# Patient Record
Sex: Female | Born: 1960 | Race: White | Hispanic: No | Marital: Married | Smoking: Never smoker
Health system: Southern US, Community
[De-identification: ages and names within clinical notes are randomized; demographics above are authoritative.]

## PROBLEM LIST (undated history)

## (undated) DIAGNOSIS — J45909 Unspecified asthma, uncomplicated: Secondary | ICD-10-CM

## (undated) HISTORY — PX: OTHER SURGICAL HISTORY: SHX169

---

## 2003-08-01 ENCOUNTER — Emergency Department (HOSPITAL_COMMUNITY): Admission: EM | Admit: 2003-08-01 | Discharge: 2003-08-01 | Payer: Self-pay | Admitting: Emergency Medicine

## 2003-08-06 ENCOUNTER — Encounter: Admission: RE | Admit: 2003-08-06 | Discharge: 2003-08-06 | Payer: Self-pay | Admitting: Specialist

## 2004-05-31 IMAGING — CT CT EXTREM LOW W/O CM*R*
3 of 4 series · 15 of 33 positions shown, 18 images · non-contrast
Comparison: none

CLINICAL DATA: Trimalleolar ankle fracture with small posterior talar fracture, [REDACTED] radiograph 08/01/03, post injury 07/31/03 for further assessment with CT. 
CT RIGHT LOWER EXTREMITY W/O CONTRAST
High resolution bone algorithm technique axial images were obtained through the left ankle in cast with no IV contrast and comparison made with [REDACTED] radiographs right tibia/fibula, ankle, and foot of 08/01/03.  Comminuted essentially non displaced medial malleolar fracture is seen.  Tiny chip fracture is seen at the posterior malleolus (images 11-10).  Non displaced distal fibular fracture is also noted.  Small posterior talar fracture shows 2mm posterior displacement (image 17).    Minimally displaced anterior 3mm fracture fragment and more posteriorly non displaced medial talar fracture is seen (images 17-20). 
IMPRESSION
Since right ankle [REDACTED] radiograph of 08/01/03 no interval change: 
1.  Trimalleolar ankle fracture most marked at the medial malleolus. 
2.  Small posteriorly displaced talar fracture.
3.  Medial talar fracture as described. 
CT MULTIPLANAR RECONSTRUCTION
From axial source images sagittal and coronal images were obtained through the right ankle and hindfoot.  Comparison is made with [REDACTED] right foot ankle tibia/fibula radiograph 08/01/03 and axial CT [REDACTED] 08/06/03.  Comminuted intra-articular fracture extending to the anterior tibial plafond is seen at the medial malleolus.  Slight 2mm posteriorly displaced fracture fragment is seen at the posterior medial distal tibia with no extension to the articular surface appreciated.  Non displaced distal fibular/lateral malleolus fracture is noted.  Best seen on coronal image 19 is faint 2 and 1mm possible loose bodies.  Small avulsion fracture fragments are seen along the deltoid ligament (image 18).  Similarly tiny 1mm avulsion fracture fragment is seen along the lateral collateral ligament as well.  Remainder of study is as described in axial CT and previous radiographic support.   Non displaced medial talar fracture is seen (images 20-23). 
1.  Trimalleolar right ankle fracture especially at the medial malleolus with probable tiny medial loose bodies at the tibiotalar joint. 
2.  Tiny associated avulsion fracture fragments at the medial and lateral collateral ligaments.  
3.  Small non displaced medial talar fracture.

[Series 3: recon 2: · axial · 0.31mm/px · z∈[-318,-228]mm · 7 of 181 slices shown, 9 images]
[im 19/181  soft-tissue]
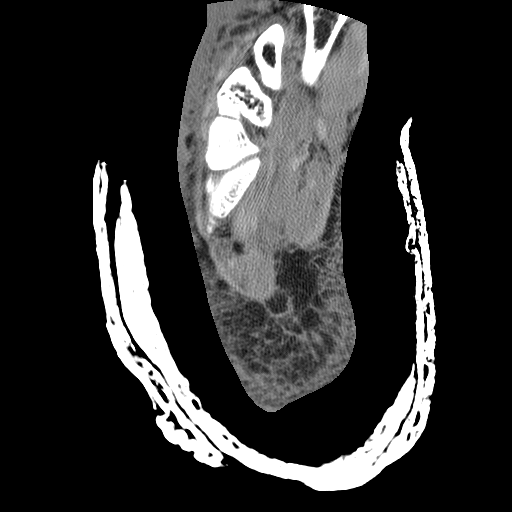
[im 19/181  bone]
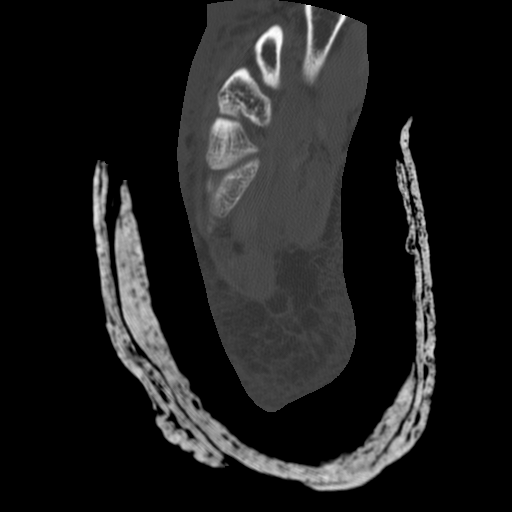
[im 37/181  bone]
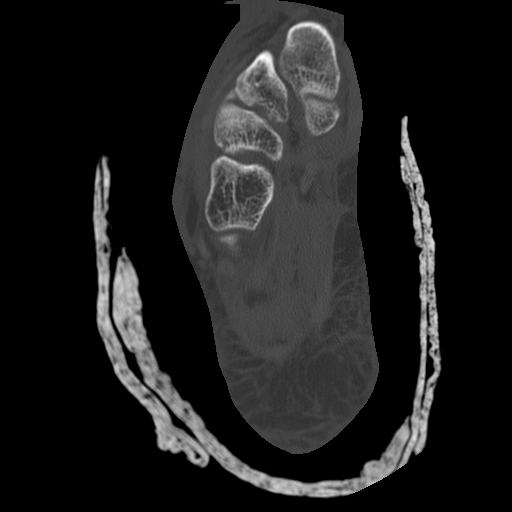
[im 73/181  bone]
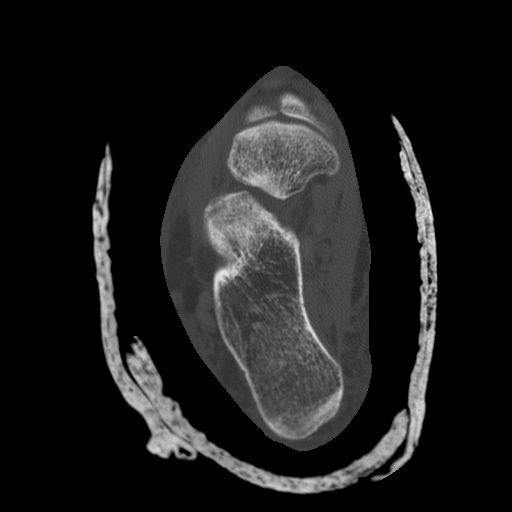
[im 91/181  bone]
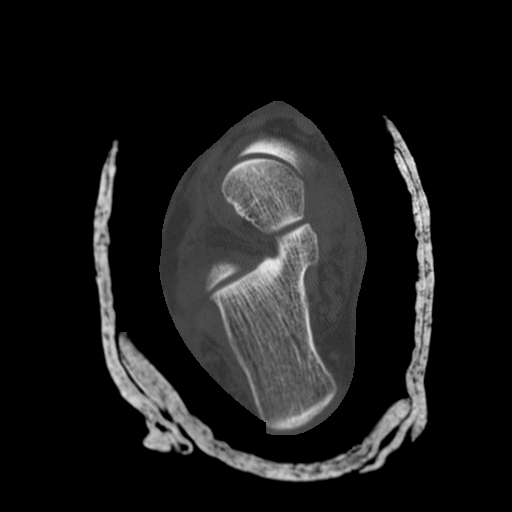
[im 109/181  soft-tissue]
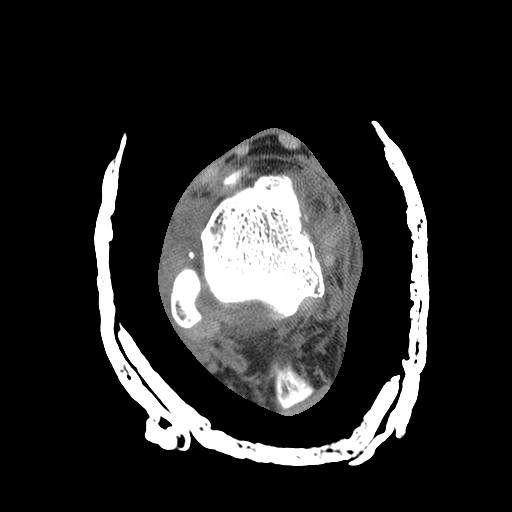
[im 109/181  bone]
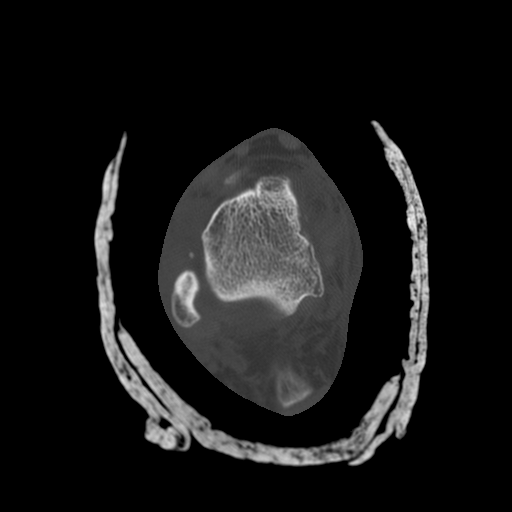
[im 145/181  bone]
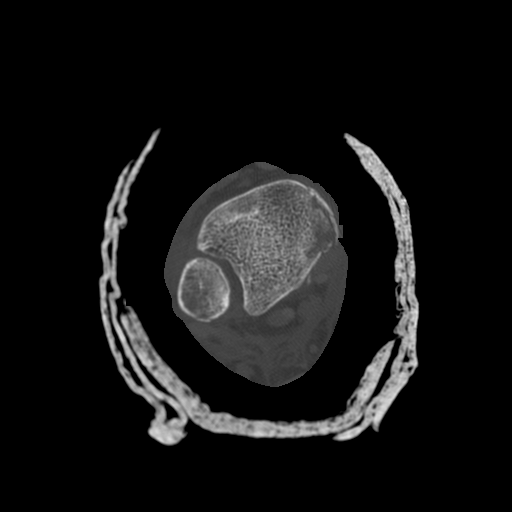
[im 163/181  bone]
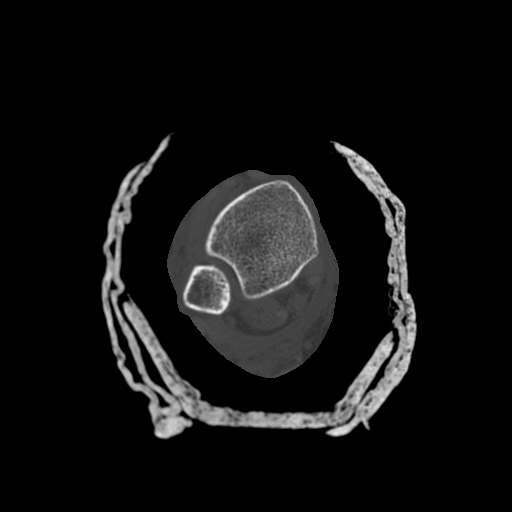

[Series 615: reformatted · coronal · 0.20mm/px · 3 of 40 slices shown (1 of 2)]
[im 8/40  bone]
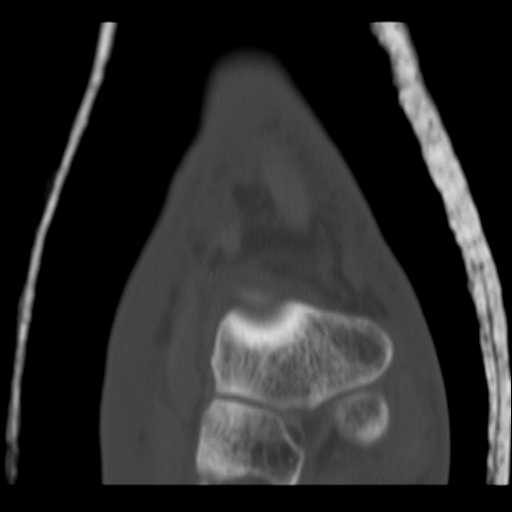
[im 16/40  bone]
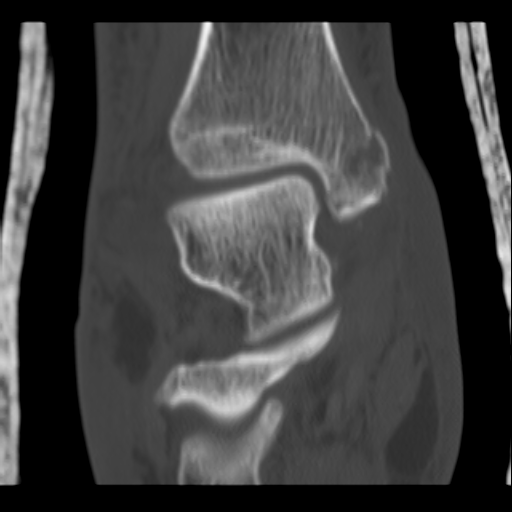
[im 24/40  bone]
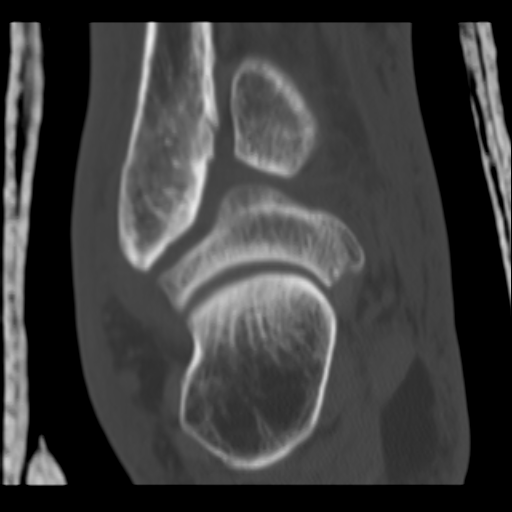

[Series 616: reformatted · sagittal · 0.23mm/px · 5 of 30 slices shown, 6 images (2 of 2)]
[im 10/30  bone]
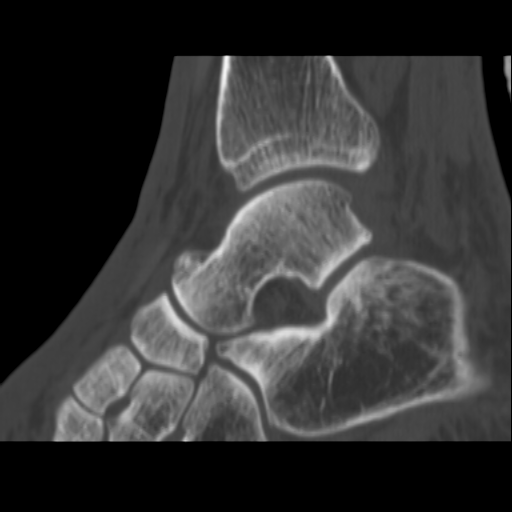
[im 13/30  bone]
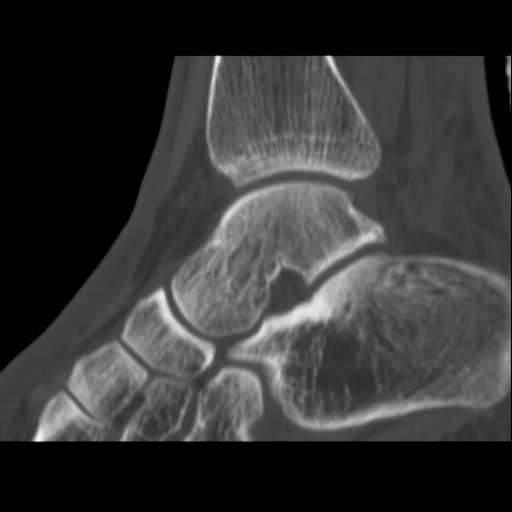
[im 15/30  soft-tissue]
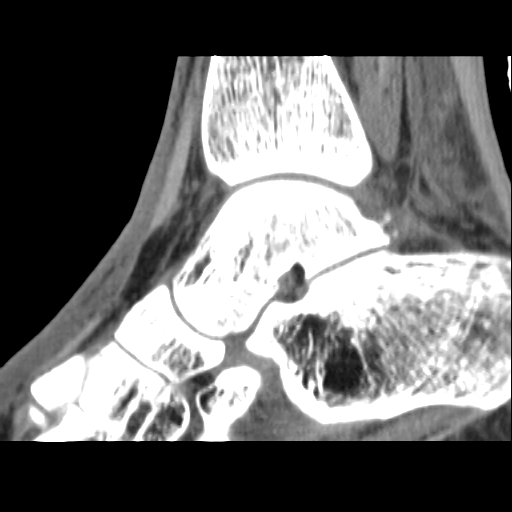
[im 15/30  bone]
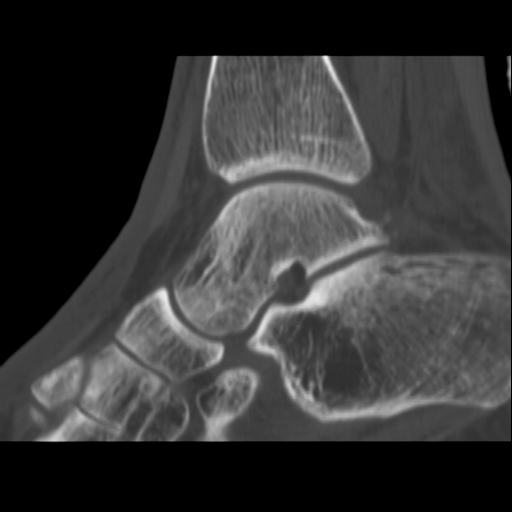
[im 17/30  bone]
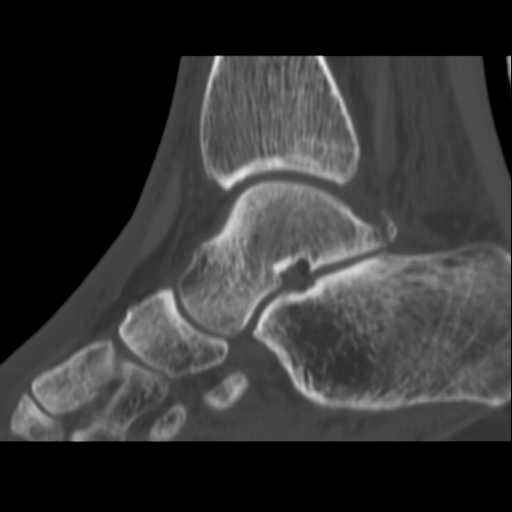
[im 20/30  bone]
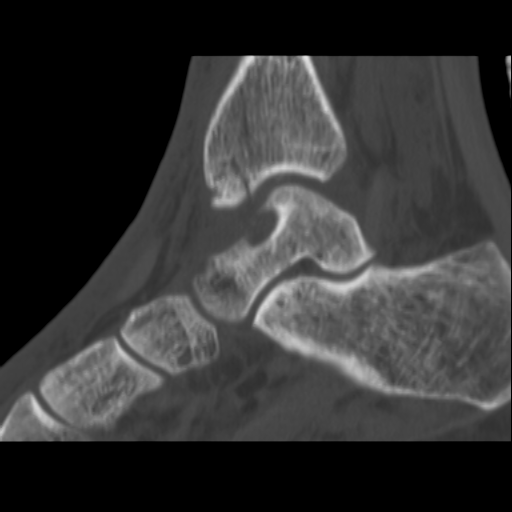

[15 of 33 positions shown; findings below may reference images not displayed]

## 2018-01-15 ENCOUNTER — Ambulatory Visit: Payer: Self-pay | Admitting: Orthopedic Surgery

## 2018-01-15 ENCOUNTER — Other Ambulatory Visit: Payer: Self-pay | Admitting: Orthopedic Surgery

## 2018-01-16 NOTE — Pre-Procedure Instructions (Signed)
Macarena Langseth  01/16/2018      [facility] - [place], [place] - [address] AT [facility] [address] [place] [address] Phone: [phone] Fax: [phone]    Your procedure is scheduled on 01/18/2018.  Report to [facility] Admitting at [phone] A.M.  Call this number if you have problems the morning of surgery:  [phone]   Remember:  Nothing to eat or drink after midnight the night before your surgery.    Take these medicines the morning of surgery with A SIP OF WATER: Montelukast (Singular)  7 days prior to surgery STOP taking any Aspirin (unless otherwise instructed by your surgeon), Aleve, Naproxen, Ibuprofen, Motrin, Advil, Goody's, BC's, all herbal medications, fish oil, and all vitamins     Do not wear jewelry, make-up or nail polish.  Do not wear lotions, powders, or perfumes, or deodorant.  Do not shave 48 hours prior to surgery.    Do not bring valuables to the hospital.  [facility] is not responsible for any belongings or valuables.  Hearing aids, eyeglasses, contacts, dentures or bridgework may not be worn into surgery.  Leave your suitcase in the car.  After surgery it may be brought to your room.  For patients admitted to the hospital, discharge time will be determined by your treatment team.  Patients discharged the day of surgery will not be allowed to drive home.   Name and phone number of your driver:    Special instructions:   [facility]- Preparing For Surgery  Before surgery, you can play an important role. Because skin is not sterile, your skin needs to be as free of germs as possible. You can reduce the number of germs on your skin by washing with CHG (chlorahexidine gluconate) Soap before surgery.  CHG is an antiseptic cleaner which kills germs and bonds with the skin to continue killing germs even after washing.    Oral Hygiene is also important to reduce your risk of infection.   Remember - BRUSH YOUR TEETH THE MORNING OF SURGERY WITH YOUR REGULAR TOOTHPASTE  Please do not use if you have an allergy to CHG or antibacterial soaps. If your skin becomes reddened/irritated stop using the CHG.  Do not shave (including legs and underarms) for at least 48 hours prior to first CHG shower. It is OK to shave your face.  Please follow these instructions carefully.   1. Shower the NIGHT BEFORE SURGERY and the MORNING OF SURGERY with CHG.   2. If you chose to wash your hair, wash your hair first as usual with your normal shampoo.  3. After you shampoo, rinse your hair and body thoroughly to remove the shampoo.  4. Use CHG as you would any other liquid soap. You can apply CHG directly to the skin and wash gently with a scrungie or a clean washcloth.   5. Apply the CHG Soap to your body ONLY FROM THE NECK DOWN.  Do not use on open wounds or open sores. Avoid contact with your eyes, ears, mouth and genitals (private parts). Wash Face and genitals (private parts)  with your normal soap.  6. Wash thoroughly, paying special attention to the area where your surgery will be performed.  7. Thoroughly rinse your body with warm water from the neck down.  8. DO NOT shower/wash with your normal soap after using and rinsing off the CHG Soap.  9. Pat yourself dry  with a CLEAN TOWEL.  10. Wear CLEAN PAJAMAS to bed the night before surgery, wear comfortable clothes the morning of surgery  11. Place CLEAN SHEETS on your bed the night of your first shower and DO NOT SLEEP WITH PETS.    Day of Surgery: Shower as stated above. Do not apply any deodorants/lotions.  Please wear clean clothes to the hospital/surgery center.   Remember to brush your teeth WITH YOUR REGULAR TOOTHPASTE.    Please read over the following fact sheets that you were given.

## 2018-01-17 ENCOUNTER — Encounter (HOSPITAL_COMMUNITY)
Admission: RE | Admit: 2018-01-17 | Discharge: 2018-01-17 | Disposition: A | Discharge status: Home / Self Care | Source: Ambulatory Visit | Attending: Orthopedic Surgery | Admitting: Orthopedic Surgery

## 2018-01-17 ENCOUNTER — Encounter (HOSPITAL_COMMUNITY): Payer: Self-pay

## 2018-01-17 ENCOUNTER — Other Ambulatory Visit: Payer: Self-pay

## 2018-01-17 DIAGNOSIS — S6412XA Injury of median nerve at wrist and hand level of left arm, initial encounter: Secondary | ICD-10-CM | POA: Diagnosis not present

## 2018-01-17 DIAGNOSIS — S52572A Other intraarticular fracture of lower end of left radius, initial encounter for closed fracture: Secondary | ICD-10-CM | POA: Diagnosis present

## 2018-01-17 DIAGNOSIS — Z91040 Latex allergy status: Secondary | ICD-10-CM | POA: Diagnosis not present

## 2018-01-17 DIAGNOSIS — Z01812 Encounter for preprocedural laboratory examination: Secondary | ICD-10-CM | POA: Diagnosis not present

## 2018-01-17 DIAGNOSIS — J45909 Unspecified asthma, uncomplicated: Secondary | ICD-10-CM | POA: Diagnosis not present

## 2018-01-17 DIAGNOSIS — Z79899 Other long term (current) drug therapy: Secondary | ICD-10-CM | POA: Diagnosis not present

## 2018-01-17 DIAGNOSIS — X58XXXA Exposure to other specified factors, initial encounter: Secondary | ICD-10-CM | POA: Diagnosis not present

## 2018-01-17 DIAGNOSIS — Z885 Allergy status to narcotic agent status: Secondary | ICD-10-CM | POA: Diagnosis not present

## 2018-01-17 HISTORY — DX: Unspecified asthma, uncomplicated: J45.909

## 2018-01-17 LAB — CBC
HCT: 39.6 % (ref 36.0–46.0)
HEMOGLOBIN: 13.6 g/dL (ref 12.0–15.0)
MCH: 32.2 pg (ref 26.0–34.0)
MCHC: 34.3 g/dL (ref 30.0–36.0)
MCV: 93.6 fL (ref 78.0–100.0)
Platelets: 239 10*3/uL (ref 150–400)
RBC: 4.23 MIL/uL (ref 3.87–5.11)
RDW: 11.9 % (ref 11.5–15.5)
WBC: 5.9 10*3/uL (ref 4.0–10.5)

## 2018-01-17 LAB — SURGICAL PCR SCREEN
MRSA, PCR: NEGATIVE
Staphylococcus aureus: NEGATIVE

## 2018-01-17 NOTE — Progress Notes (Signed)
PCP - Georges Lynch at [facility] in [place] Cardiologist - patient denies  Chest x-ray - n/a EKG - n/a Stress Test - patient denies ECHO - patient denies Cardiac Cath - patient denies  Sleep Study - patient denies  Anesthesia review: n/a  Patient denies shortness of breath, fever, cough and chest pain at PAT appointment   Patient verbalized understanding of instructions that were given to them at the PAT appointment. Patient was also instructed that they will need to review over the PAT instructions again at home before surgery.

## 2018-01-18 ENCOUNTER — Ambulatory Visit (HOSPITAL_COMMUNITY): Admitting: Anesthesiology

## 2018-01-18 ENCOUNTER — Encounter (HOSPITAL_COMMUNITY): Payer: Self-pay | Admitting: *Deleted

## 2018-01-18 ENCOUNTER — Ambulatory Visit (HOSPITAL_COMMUNITY): Admitting: Emergency Medicine

## 2018-01-18 ENCOUNTER — Encounter (HOSPITAL_COMMUNITY)
Admission: RE | Disposition: A | Discharge status: Home / Self Care | Payer: Self-pay | Source: Ambulatory Visit | Attending: Orthopedic Surgery

## 2018-01-18 ENCOUNTER — Observation Stay (HOSPITAL_COMMUNITY)
Admission: RE | Admit: 2018-01-18 | Discharge: 2018-01-19 | Disposition: A | Discharge status: Home / Self Care | Source: Ambulatory Visit | Attending: Orthopedic Surgery | Admitting: Orthopedic Surgery

## 2018-01-18 DIAGNOSIS — S52572A Other intraarticular fracture of lower end of left radius, initial encounter for closed fracture: Secondary | ICD-10-CM | POA: Diagnosis not present

## 2018-01-18 DIAGNOSIS — Z91040 Latex allergy status: Secondary | ICD-10-CM | POA: Insufficient documentation

## 2018-01-18 DIAGNOSIS — J45909 Unspecified asthma, uncomplicated: Secondary | ICD-10-CM | POA: Insufficient documentation

## 2018-01-18 DIAGNOSIS — Z885 Allergy status to narcotic agent status: Secondary | ICD-10-CM | POA: Insufficient documentation

## 2018-01-18 DIAGNOSIS — Z79899 Other long term (current) drug therapy: Secondary | ICD-10-CM | POA: Insufficient documentation

## 2018-01-18 DIAGNOSIS — Z01812 Encounter for preprocedural laboratory examination: Secondary | ICD-10-CM | POA: Insufficient documentation

## 2018-01-18 DIAGNOSIS — S6412XA Injury of median nerve at wrist and hand level of left arm, initial encounter: Secondary | ICD-10-CM | POA: Insufficient documentation

## 2018-01-18 DIAGNOSIS — S5290XA Unspecified fracture of unspecified forearm, initial encounter for closed fracture: Secondary | ICD-10-CM | POA: Diagnosis present

## 2018-01-18 DIAGNOSIS — X58XXXA Exposure to other specified factors, initial encounter: Secondary | ICD-10-CM | POA: Insufficient documentation

## 2018-01-18 HISTORY — PX: ORIF RADIAL FRACTURE: SHX5113

## 2018-01-18 SURGERY — OPEN REDUCTION INTERNAL FIXATION (ORIF) RADIAL FRACTURE
Anesthesia: Monitor Anesthesia Care | Site: Wrist | Laterality: Left

## 2018-01-18 MED ORDER — FENTANYL CITRATE (PF) 100 MCG/2ML IJ SOLN
INTRAMUSCULAR | Status: AC
  Filled 2018-01-18: qty 2

## 2018-01-18 MED ORDER — MIDAZOLAM HCL 2 MG/2ML IJ SOLN
INTRAMUSCULAR | Status: DC | PRN
  Administered 2018-01-18: 2 mg via INTRAVENOUS

## 2018-01-18 MED ORDER — OXYCODONE HCL 5 MG PO TABS
5.0000 mg | ORAL_TABLET | ORAL | Status: DC | PRN
  Administered 2018-01-18: 5 mg via ORAL
  Administered 2018-01-19 (×2): 10 mg via ORAL
  Filled 2018-01-18 (×3): qty 2

## 2018-01-18 MED ORDER — ALPRAZOLAM 0.5 MG PO TABS
0.5000 mg | ORAL_TABLET | Freq: Four times a day (QID) | ORAL | Status: DC | PRN
  Filled 2018-01-18: qty 1

## 2018-01-18 MED ORDER — CEFAZOLIN SODIUM-DEXTROSE 1-4 GM/50ML-% IV SOLN
1.0000 g | INTRAVENOUS | Status: AC
  Filled 2018-01-18: qty 50

## 2018-01-18 MED ORDER — CHLORHEXIDINE GLUCONATE 4 % EX LIQD: 60.0000 mL | Freq: Once | CUTANEOUS | Status: DC

## 2018-01-18 MED ORDER — LACTATED RINGERS IV SOLN: INTRAVENOUS | Status: DC

## 2018-01-18 MED ORDER — PROPOFOL 500 MG/50ML IV EMUL
INTRAVENOUS | Status: DC | PRN
  Administered 2018-01-18: 100 ug/kg/min via INTRAVENOUS

## 2018-01-18 MED ORDER — METHOCARBAMOL 1000 MG/10ML IJ SOLN
500.0000 mg | Freq: Four times a day (QID) | INTRAVENOUS | Status: DC | PRN
  Filled 2018-01-18: qty 5

## 2018-01-18 MED ORDER — ONDANSETRON HCL 4 MG/2ML IJ SOLN: 4.0000 mg | Freq: Four times a day (QID) | INTRAMUSCULAR | Status: DC | PRN

## 2018-01-18 MED ORDER — 0.9 % SODIUM CHLORIDE (POUR BTL) OPTIME
TOPICAL | Status: DC | PRN
  Administered 2018-01-18: 1000 mL

## 2018-01-18 MED ORDER — FENTANYL CITRATE (PF) 100 MCG/2ML IJ SOLN: 25.0000 ug | INTRAMUSCULAR | Status: DC | PRN

## 2018-01-18 MED ORDER — PROMETHAZINE HCL 12.5 MG RE SUPP
12.5000 mg | Freq: Four times a day (QID) | RECTAL | Status: DC | PRN
  Filled 2018-01-18: qty 1

## 2018-01-18 MED ORDER — PROPOFOL 1000 MG/100ML IV EMUL
INTRAVENOUS | Status: AC
  Filled 2018-01-18: qty 100

## 2018-01-18 MED ORDER — EPHEDRINE SULFATE-NACL 50-0.9 MG/10ML-% IV SOSY
PREFILLED_SYRINGE | INTRAVENOUS | Status: DC | PRN
  Administered 2018-01-18: 10 mg via INTRAVENOUS
  Administered 2018-01-18: 5 mg via INTRAVENOUS

## 2018-01-18 MED ORDER — CEFAZOLIN SODIUM-DEXTROSE 1-4 GM/50ML-% IV SOLN
1.0000 g | Freq: Three times a day (TID) | INTRAVENOUS | Status: DC
  Administered 2018-01-18 – 2018-01-19 (×3): 1 g via INTRAVENOUS
  Filled 2018-01-18 (×3): qty 50

## 2018-01-18 MED ORDER — DEXAMETHASONE SODIUM PHOSPHATE 10 MG/ML IJ SOLN
INTRAMUSCULAR | Status: AC
  Filled 2018-01-18: qty 1

## 2018-01-18 MED ORDER — ONDANSETRON HCL 4 MG PO TABS: 4.0000 mg | ORAL_TABLET | Freq: Four times a day (QID) | ORAL | Status: DC | PRN

## 2018-01-18 MED ORDER — DEXAMETHASONE SODIUM PHOSPHATE 10 MG/ML IJ SOLN
INTRAMUSCULAR | Status: DC | PRN
  Administered 2018-01-18: 10 mg via INTRAVENOUS

## 2018-01-18 MED ORDER — FENTANYL CITRATE (PF) 100 MCG/2ML IJ SOLN
INTRAMUSCULAR | Status: DC | PRN
  Administered 2018-01-18: 50 ug via INTRAVENOUS

## 2018-01-18 MED ORDER — METHOCARBAMOL 500 MG PO TABS
500.0000 mg | ORAL_TABLET | Freq: Four times a day (QID) | ORAL | Status: DC | PRN
  Administered 2018-01-18 – 2018-01-19 (×2): 500 mg via ORAL
  Filled 2018-01-18 (×2): qty 1

## 2018-01-18 MED ORDER — MONTELUKAST SODIUM 10 MG PO TABS
10.0000 mg | ORAL_TABLET | ORAL | Status: DC
  Administered 2018-01-19: 10 mg via ORAL
  Filled 2018-01-18: qty 1

## 2018-01-18 MED ORDER — ONDANSETRON HCL 4 MG/2ML IJ SOLN: 4.0000 mg | Freq: Once | INTRAMUSCULAR | Status: DC | PRN

## 2018-01-18 MED ORDER — CEFAZOLIN SODIUM-DEXTROSE 2-4 GM/100ML-% IV SOLN
2.0000 g | INTRAVENOUS | Status: AC
  Administered 2018-01-18: 2 g via INTRAVENOUS
  Filled 2018-01-18: qty 100

## 2018-01-18 MED ORDER — FAMOTIDINE 20 MG PO TABS: 20.0000 mg | ORAL_TABLET | Freq: Two times a day (BID) | ORAL | Status: DC | PRN

## 2018-01-18 MED ORDER — ONDANSETRON HCL 4 MG/2ML IJ SOLN
INTRAMUSCULAR | Status: AC
  Filled 2018-01-18: qty 2

## 2018-01-18 MED ORDER — DOCUSATE SODIUM 100 MG PO CAPS
100.0000 mg | ORAL_CAPSULE | Freq: Every day | ORAL | Status: DC
  Administered 2018-01-19: 100 mg via ORAL
  Filled 2018-01-18: qty 1

## 2018-01-18 MED ORDER — EPHEDRINE SULFATE 50 MG/ML IJ SOLN
INTRAMUSCULAR | Status: AC
  Filled 2018-01-18: qty 1

## 2018-01-18 MED ORDER — HYDROMORPHONE HCL 2 MG/ML IJ SOLN: 0.5000 mg | INTRAMUSCULAR | Status: DC | PRN

## 2018-01-18 MED ORDER — LACTATED RINGERS IV SOLN
INTRAVENOUS | Status: DC
  Administered 2018-01-18: 08:00:00 via INTRAVENOUS

## 2018-01-18 MED ORDER — PROPOFOL 10 MG/ML IV BOLUS
INTRAVENOUS | Status: AC
  Filled 2018-01-18: qty 20

## 2018-01-18 MED ORDER — PHENYLEPHRINE HCL 10 MG/ML IJ SOLN
INTRAVENOUS | Status: DC | PRN
  Administered 2018-01-18: 25 ug/min via INTRAVENOUS

## 2018-01-18 MED ORDER — VITAMIN C 500 MG PO TABS
1000.0000 mg | ORAL_TABLET | Freq: Every day | ORAL | Status: DC
  Administered 2018-01-19: 1000 mg via ORAL
  Filled 2018-01-18: qty 2

## 2018-01-18 MED ORDER — MIDAZOLAM HCL 2 MG/2ML IJ SOLN
INTRAMUSCULAR | Status: AC
  Filled 2018-01-18: qty 2

## 2018-01-18 MED ORDER — FENTANYL CITRATE (PF) 250 MCG/5ML IJ SOLN
INTRAMUSCULAR | Status: AC
  Filled 2018-01-18: qty 5

## 2018-01-18 MED ORDER — ONDANSETRON HCL 4 MG/2ML IJ SOLN
INTRAMUSCULAR | Status: DC | PRN
  Administered 2018-01-18: 4 mg via INTRAVENOUS

## 2018-01-18 SURGICAL SUPPLY — 53 items
BANDAGE ACE 3X5.8 VEL STRL LF (GAUZE/BANDAGES/DRESSINGS) ×3 IMPLANT
BANDAGE ACE 4X5 VEL STRL LF (GAUZE/BANDAGES/DRESSINGS) ×3 IMPLANT
BIT DRILL 2.2 SS TIBIAL (BIT) ×3 IMPLANT
BLADE SURG 15 STRL LF DISP TIS (BLADE) ×1 IMPLANT
BLADE SURG 15 STRL SS (BLADE) ×2
BNDG ESMARK 4X9 LF (GAUZE/BANDAGES/DRESSINGS) ×3 IMPLANT
BNDG GAUZE ELAST 4 BULKY (GAUZE/BANDAGES/DRESSINGS) ×3 IMPLANT
CORDS BIPOLAR (ELECTRODE) ×3 IMPLANT
COVER SURGICAL LIGHT HANDLE (MISCELLANEOUS) ×3 IMPLANT
CUFF TOURNIQUET SINGLE 18IN (TOURNIQUET CUFF) ×3 IMPLANT
DRAPE OEC MINIVIEW 54X84 (DRAPES) ×3 IMPLANT
DRSG ADAPTIC 3X8 NADH LF (GAUZE/BANDAGES/DRESSINGS) IMPLANT
DRSG MEPITEL 4X7.2 (GAUZE/BANDAGES/DRESSINGS) ×3 IMPLANT
GAUZE SPONGE 4X4 12PLY STRL (GAUZE/BANDAGES/DRESSINGS) ×3 IMPLANT
GAUZE XEROFORM 5X9 LF (GAUZE/BANDAGES/DRESSINGS) ×3 IMPLANT
GOWN STRL REUS W/ TWL LRG LVL3 (GOWN DISPOSABLE) ×1 IMPLANT
GOWN STRL REUS W/ TWL XL LVL3 (GOWN DISPOSABLE) ×1 IMPLANT
GOWN STRL REUS W/TWL LRG LVL3 (GOWN DISPOSABLE) ×2
GOWN STRL REUS W/TWL XL LVL3 (GOWN DISPOSABLE) ×2
KIT BASIN OR (CUSTOM PROCEDURE TRAY) ×3 IMPLANT
KIT TURNOVER KIT B (KITS) ×3 IMPLANT
NS IRRIG 1000ML POUR BTL (IV SOLUTION) ×3 IMPLANT
PACK ORTHO EXTREMITY (CUSTOM PROCEDURE TRAY) ×3 IMPLANT
PAD ARMBOARD 7.5X6 YLW CONV (MISCELLANEOUS) ×3 IMPLANT
PAD CAST 4YDX4 CTTN HI CHSV (CAST SUPPLIES) ×1 IMPLANT
PADDING CAST COTTON 4X4 STRL (CAST SUPPLIES) ×2
PEG LOCKING SMOOTH 2.2X15 (Peg) ×3 IMPLANT
PEG LOCKING SMOOTH 2.2X18 (Peg) ×3 IMPLANT
PEG LOCKING SMOOTH 2.2X20 (Screw) ×9 IMPLANT
PEG LOCKING SMOOTH 2.2X22 (Screw) ×3 IMPLANT
PLATE STD DVR LEFT (Plate) ×3 IMPLANT
PLATE STD DVR LT 24X55 (Plate) ×1 IMPLANT
PUTTY DBM STAGRAFT PLUS 2CC (Putty) ×3 IMPLANT
SCREW LOCK 12X2.7X 3 LD (Screw) ×1 IMPLANT
SCREW LOCK 14X2.7X 3 LD TPR (Screw) ×2 IMPLANT
SCREW LOCK 18X2.7X 3 LD TPR (Screw) ×1 IMPLANT
SCREW LOCKING 2.7X12MM (Screw) ×2 IMPLANT
SCREW LOCKING 2.7X13MM (Screw) ×3 IMPLANT
SCREW LOCKING 2.7X14 (Screw) ×4 IMPLANT
SCREW LOCKING 2.7X15MM (Screw) ×3 IMPLANT
SCREW LOCKING 2.7X18 (Screw) ×2 IMPLANT
SCRUB BETADINE 4OZ XXX (MISCELLANEOUS) ×3 IMPLANT
SOL PREP POV-IOD 4OZ 10% (MISCELLANEOUS) ×3 IMPLANT
SPLINT FIBERGLASS 3X35 (CAST SUPPLIES) ×3 IMPLANT
SUT PROLENE 3 0 PS 2 (SUTURE) ×6 IMPLANT
SUT PROLENE 4 0 PS 2 18 (SUTURE) ×3 IMPLANT
SUT VIC AB 3-0 FS2 27 (SUTURE) ×6 IMPLANT
SYSTEM CHEST DRAIN TLS 7FR (DRAIN) ×3 IMPLANT
TOWEL OR 17X26 10 PK STRL BLUE (TOWEL DISPOSABLE) ×3 IMPLANT
TUBE CONNECTING 12'X1/4 (SUCTIONS) ×1
TUBE CONNECTING 12X1/4 (SUCTIONS) ×2 IMPLANT
TUBE EVACUATION TLS (MISCELLANEOUS) IMPLANT
UNDERPAD 30X30 (UNDERPADS AND DIAPERS) ×3 IMPLANT

## 2018-01-18 NOTE — Anesthesia Procedure Notes (Signed)
Procedure Name: MAC Date/Time: 01/18/2018 10:55 AM Performed by: Kyung Rudd, CRNA Pre-anesthesia Checklist: Patient identified, Emergency Drugs available, Suction available and Patient being monitored Patient Re-evaluated:Patient Re-evaluated prior to induction Oxygen Delivery Method: Simple face mask Induction Type: IV induction Placement Confirmation: positive ETCO2 Dental Injury: Teeth and Oropharynx as per pre-operative assessment

## 2018-01-18 NOTE — Op Note (Signed)
NAMSerafina Palmer: [place], Adyson L. MEDICAL RECORD [id number] ACCOUNT [id number] DATE OF BIRTH:May 30, 1961 FACILITY: [facility] LOCATION: [facility] PHYSICIAN:Eila Runyan M. Amanda PeaGRAMIG, MD  OPERATIVE REPORT  DATE OF PROCEDURE:  01/18/2018  PREOPERATIVE DIAGNOSES: 1.  Left comminuted complex greater than five-part intraarticular distal radius fracture. 2.  Left median nerve neuropathy/contusive injury.  POSTOPERATIVE DIAGNOSES: 1.  Left comminuted complex greater than five-part intraarticular distal radius fracture. 2.  Left median nerve neuropathy/contusive injury.  PROCEDURE: 1.  Open reduction internal fixation greater than five-part intraarticular distal radius fracture with extended volar rim, DVR plate, and screw construct. 2.  Left open carpal tunnel release. 3.  AP, lateral and oblique x-rays performed, examined and interpreted by myself.  SURGEON:  Dominica SeverinWilliam Davionne Mastrangelo, MD  ASSISTANT:  None.  COMPLICATIONS:  None.  ANESTHESIA:  Block with IV sedation.  TOURNIQUET TIME:  Less than an hour.   DRAINS:  1  INDICATIONS:  A pleasant female with comminuted complex greater than five-part intraarticular distal radius fracture with median nerve neuropraxic injury, cannot rule out compression phenomenon.  We have outlined the risks and benefits of surgery, and  she desires to proceed.  DESCRIPTION OF PROCEDURE:  The patient was seen by myself and anesthesia and taken to the operative theater and underwent smooth induction of IV sedation.  The arm was prepped with Hibiclens pre-scrub followed by a 10-minute surgical Betadine scrub and  paint, followed by isolation of a sterile field.  Timeout was observed.  Following this, an incision was made under 250 mm tourniquet control.  Dissection was carried down, and the FCR tendon sheath was incised palmarly and dorsally.  Carpal canal  contents were retracted ulnarly.  Pronator incised.  Fracture accessed.  I reduced the fracture, placed 2 mL of StaGraft bone graft  in the dorsal V defect, and following this applied an extended volar rim plate and screw construct.  I was able to achieve  adequate height, inclination and volar tilt without difficulty.  AP, lateral and oblique x-rays were performed and examined and interpreted by myself and looked to be excellent.  I was quite pleased with the findings.  Following this, I closed the  pronator, checked final copy x-rays, irrigated and placed a TLS drain.  Following this, I made a 1-inch incision overlying the carpal canal, dissected down, released the palmar fascia, and following this release, the transverse carpal ligament.  Fat pad egressed nicely.  There were no complicating features.  There was some  small blood in the canal and evidence of median nerve contusive injury.  There were no complicating features.  This was an uneventful carpal tunnel release.  I feel this will give the nerve maximum oxygen and blood flow potential for neural recovery  after a neuropraxic injury in my estimation.  Areas were irrigated with the tourniquet deflated.  Wounds closed with 3-0 Prolene in the skin edge at the volar radial incision, 4-0 Prolene at the carpal tunnel site.  Standard dressing of Mepitel, Xeroform and standard distal radius dressing was  applied.  She was taken to recovery room in stable condition.  Elevate, move, massage, IV antibiotics, pain medicine and other measures will be adhered to.  Should any problems occur, I will be immediately available.  It was a pleasure to participate in her care.  I look forward to participating in postop recovery.  We will proceed with a standard DVR postop protocol in my office.  LN/NUANCE  D:01/18/2018 T:01/18/2018 JOB:[id number]

## 2018-01-18 NOTE — Progress Notes (Signed)
Pt arrived to 6n05 at this time, oriented to room and surroundings, husband at [place], pt states that arm is numb, cannot wiggle fingers, fingers warm to touch.

## 2018-01-18 NOTE — Op Note (Signed)
See dictation#[id number]  SP ORIF DRF Left with CTR  Tenya Araque MD

## 2018-01-18 NOTE — Anesthesia Procedure Notes (Signed)
Anesthesia Regional Block: Supraclavicular block   Pre-Anesthetic Checklist: ,, timeout performed, Correct Patient, Correct Site, Correct Laterality, Correct Procedure, Correct Position, site marked, Risks and benefits discussed,  Surgical consent,  Pre-op evaluation,  At surgeon's request and post-op pain management  Laterality: Left  Prep: chloraprep       Needles:  Injection technique: Single-shot  Needle Type: Stimulator Needle - 80          Additional Needles:   Procedures:,,,, ultrasound used (permanent image in chart),,,,  Narrative:  Start time: 01/18/2018 8:50 AM End time: 01/18/2018 9:00 AM Injection made incrementally with aspirations every 5 mL.  Events: injection painful,,,,,,,,,,  Performed by: Personally   Additional Notes: 20 cc 0.75% Ropivacaine with 0.2 mg clonidine injected easily

## 2018-01-18 NOTE — H&P (Signed)
Jenna Palmer is an 57 y.o. female.   Chief Complaint: Displaced left distal radius fracture with median nerve neuropraxia HPI: Patient presents for evaluation and treatment of the of their upper extremity predicament. The patient denies neck, back, chest or  abdominal pain. The patient notes that they have no lower extremity problems. The patients primary complaint is noted. We are planning surgical care pathway for the upper extremity.  Past Medical History:  Diagnosis Date  . Asthma    "only at times, but doesn't use an inhaler"    Past Surgical History:  Procedure Laterality Date  . CESAREAN SECTION     x2  . lasik Left    PRK surgery    History reviewed. No pertinent family history. Social History:  reports that she has never smoked. She has never used smokeless tobacco. She reports that she drinks alcohol. She reports that she does not use drugs.  Allergies:  Allergies  Allergen Reactions  . Codeine Other (See Comments)    Upset stomach  . Latex Rash    Medications Prior to Admission  Medication Sig Dispense Refill  . Ascorbic Acid (VITAMIN C) 1000 MG tablet Take 1,000 mg by mouth daily.    Marland Kitchen. docusate sodium (COLACE) 100 MG capsule Take 100 mg by mouth daily.    Marland Kitchen. ibuprofen (ADVIL,MOTRIN) 200 MG tablet Take 600 mg by mouth every 6 (six) hours as needed for headache or moderate pain.    . montelukast (SINGULAIR) 10 MG tablet Take 10 mg by mouth every morning.  0    Results for orders placed or performed during the hospital encounter of 01/17/18 (from the past 48 hour(s))  CBC     Status: None   Collection Time: 01/17/18  3:40 PM  Result Value Ref Range   WBC 5.9 4.0 - 10.5 K/uL   RBC 4.23 3.87 - 5.11 MIL/uL   Hemoglobin 13.6 12.0 - 15.0 g/dL   HCT 16.139.6 09.636.0 - 04.546.0 %   MCV 93.6 78.0 - 100.0 fL   MCH 32.2 26.0 - 34.0 pg   MCHC 34.3 30.0 - 36.0 g/dL   RDW 40.911.9 81.111.5 - 91.415.5 %   Platelets 239 150 - 400 K/uL    Comment: Performed at [facility], [address] [place], [place] [address]  Surgical pcr screen     Status: None   Collection Time: 01/17/18  3:40 PM  Result Value Ref Range   MRSA, PCR NEGATIVE NEGATIVE   Staphylococcus aureus NEGATIVE NEGATIVE    Comment: (NOTE) The Xpert SA Assay (FDA approved for NASAL specimens in patients 59 years of age and older), is one component of a comprehensive surveillance program. It is not intended to diagnose infection nor to guide or monitor treatment. Performed at [facility], [address] [place], [place] [address]    No results found.  Review of Systems  Respiratory: Negative.   Cardiovascular: Negative.   Gastrointestinal: Negative.   Genitourinary: Negative.     Blood pressure 134/82, pulse (!) 51, temperature 97.7 F (36.5 C), temperature source Oral, resp. rate 20, SpO2 97 %. Physical Exam  Patient has findings consistent with median nerve neuropraxia versus compression as well as a displaced comminuted distal radius fracture.  We will plan for open reduction internal fixation.  She understands risk and benefits.  Her intrinsic function is intact.  She feels as though her small finger has a bit of uncoordinated movement but certainly she flexes and extends  normally and does not have any obvious signs of a fracture about the finger.  I will be to reconstruct her wrist and try to give her back her normal geometry.  She understands this.  The patient is alert and oriented in no acute distress. The patient complains of pain in the affected upper extremity.  The patient is noted to have a normal HEENT exam. Lung fields show equal chest expansion and no shortness of breath. Abdomen exam is nontender without distention. Lower extremity examination does not show any fracture dislocation or blood clot symptoms. Pelvis is stable and the neck and back are stable and nontender. Assessment/Plan He is reviewed theWe are planning surgery for your upper extremity. The risk and  benefits of surgery to include risk of bleeding, infection, anesthesia,  damage to normal structures and failure of the surgery to accomplish its intended goals of relieving symptoms and restoring function have been discussed in detail. With this in mind we plan to proceed. I have specifically discussed with the patient the pre-and postoperative regime and the dos and don'ts and risk and benefits in great detail. Risk and benefits of surgery also include risk of dystrophy(CRPS), chronic nerve pain, failure of the healing process to go onto completion and other inherent risks of surgery The relavent the pathophysiology of the disease/injury process, as well as the alternatives for treatment and postoperative course of action has been discussed in great detail with the patient who desires to proceed.  We will do everything in our power to help you (the patient) restore function to the upper extremity. It is a pleasure to see this patient today.   Oletta Cohn III, MD 01/18/2018, 8:23 AM

## 2018-01-18 NOTE — Anesthesia Postprocedure Evaluation (Signed)
Anesthesia Post Note  Patient: Jenna Palmer  Procedure(s) Performed: OPEN REDUCTION INTERNAL FIXATION (ORIF) RADIUS FRACTURE WITH CARPAL TUNNEL RELEASE AND REPAIR AS NECESSARY (Left Wrist)     Patient location during evaluation: PACU Anesthesia Type: Regional Level of consciousness: awake and alert Pain management: pain level controlled Vital Signs Assessment: post-procedure vital signs reviewed and stable Respiratory status: spontaneous breathing, nonlabored ventilation, respiratory function stable and patient connected to nasal cannula oxygen Cardiovascular status: stable and blood pressure returned to baseline Postop Assessment: no apparent nausea or vomiting Anesthetic complications: no    Last Vitals:  Vitals:   01/18/18 1145 01/18/18 1200  BP: (!) 100/51 (!) 106/57  Pulse: (!) 45 (!) 50  Resp: 11 17  Temp:  (!) 36.4 C  SpO2: 99% 100%    Last Pain:  Vitals:   01/18/18 1145  TempSrc:   PainSc: 0-No pain                 Terica Yogi COKER

## 2018-01-18 NOTE — Transfer of Care (Signed)
Immediate Anesthesia Transfer of Care Note  Patient: Jenna Palmer  Procedure(s) Performed: OPEN REDUCTION INTERNAL FIXATION (ORIF) RADIUS FRACTURE WITH CARPAL TUNNEL RELEASE AND REPAIR AS NECESSARY (Left Wrist)  Patient Location: PACU  Anesthesia Type:MAC and Regional  Level of Consciousness: awake, alert  and oriented  Airway & Oxygen Therapy: Patient Spontanous Breathing and Patient connected to nasal cannula oxygen  Post-op Assessment: Report given to RN and Post -op Vital signs reviewed and stable  Post vital signs: Reviewed and stable  Last Vitals:  Vitals Value Taken Time  BP 97/48 01/18/2018 11:29 AM  Temp    Pulse 59 01/18/2018 11:30 AM  Resp 21 01/18/2018 11:30 AM  SpO2 100 % 01/18/2018 11:30 AM  Vitals shown include unvalidated device data.  Last Pain:  Vitals:   01/18/18 0736  TempSrc: Oral         Complications: No apparent anesthesia complications

## 2018-01-18 NOTE — Anesthesia Preprocedure Evaluation (Signed)
Anesthesia Evaluation  Patient identified by MRN, date of birth, ID band Patient awake    Reviewed: Allergy & Precautions, NPO status , Patient's Chart, lab work & pertinent test results  Airway Mallampati: II  TM Distance: >3 FB     Dental  (+) Teeth Intact, Dental Advisory Given   Pulmonary    breath sounds clear to auscultation       Cardiovascular  Rhythm:Regular Rate:Normal     Neuro/Psych    GI/Hepatic   Endo/Other    Renal/GU      Musculoskeletal   Abdominal   Peds  Hematology   Anesthesia Other Findings   Reproductive/Obstetrics                             Anesthesia Physical Anesthesia Plan  ASA: II  Anesthesia Plan: General   Post-op Pain Management:  Regional for Post-op pain   Induction: Intravenous  PONV Risk Score and Plan: Ondansetron and Dexamethasone  Airway Management Planned: LMA  Additional Equipment:   Intra-op Plan:   Post-operative Plan:   Informed Consent: I have reviewed the patients History and Physical, chart, labs and discussed the procedure including the risks, benefits and alternatives for the proposed anesthesia with the patient or authorized representative who has indicated his/her understanding and acceptance.   Dental advisory given  Plan Discussed with: CRNA and Anesthesiologist  Anesthesia Plan Comments:         Anesthesia Quick Evaluation

## 2018-01-19 DIAGNOSIS — S52572A Other intraarticular fracture of lower end of left radius, initial encounter for closed fracture: Secondary | ICD-10-CM | POA: Diagnosis not present

## 2018-01-19 NOTE — Progress Notes (Signed)
Occupational Therapy Evaluation Patient Details Name: Jenna Palmer MRN: [id number] DOB: 04-27-1961 Today's Date: 01/19/2018    History of Present Illness 57 yo s/p fall resulting in a L wrist comminuted fx with median n neuropathy. Underwent ORIF and carpal tunnel release 01/18/18   Clinical Impression   Pt independent with all ADL and mobility prior to her injury. 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 Educated pt on compensatory techniques for ADL, importance of ROM of LUE, precautions and strategies for edema control. Pt verbalized understanding. Will return with elastic laces. Pt will follow up with Dr. Butler DenmarkGrammig to determine further therapy needs after DC.     Follow Up Recommendations  Follow surgeon's recommendation for DC plan and follow-up therapies;Supervision - Intermittent    Equipment Recommendations  None recommended by OT    Recommendations for Other Services       Precautions / Restrictions Precautions Precautions: Other (comment)(LUE NWB) Required Braces or Orthoses: Other Brace/Splint(postop splint) Restrictions Weight Bearing Restrictions: Yes LUE Weight Bearing: Non weight bearing      Mobility Bed Mobility Overal bed mobility: Independent                Transfers Overall transfer level: Independent                    Balance Overall balance assessment: No apparent balance deficits (not formally assessed)(fell on wet floor/mopping)                                         ADL either performed or assessed with clinical judgement   ADL Overall ADL's : Needs assistance/impaired                                     Functional mobility during ADLs: Independent General ADL Comments: Educated pt on compensatory technqieus for ADL. Demonstrated 1 handed technique for donning socks adn pt asking how to tie her shoes.will return with elastic shoe laces.      Vision         Perception      Praxis      Pertinent Vitals/Pain Pain Assessment: 0-10 Pain Score: 2  Pain Location: L wrist Pain Descriptors / Indicators: Discomfort Pain Intervention(s): Limited activity within patient's tolerance;Repositioned;Ice applied     Hand Dominance Right   Extremity/Trunk Assessment Upper Extremity Assessment Upper Extremity Assessment: LUE deficits/detail LUE Deficits / Details: moving all fingers; difficult to fully assess due to immobilization of thumb. Reports numbness in [place] n distribution LUE Sensation: decreased light touch LUE Coordination: decreased fine motor   Lower Extremity Assessment Lower Extremity Assessment: Overall WFL for tasks assessed   Cervical / Trunk Assessment Cervical / Trunk Assessment: Normal   Communication Communication Communication: No difficulties   Cognition Arousal/Alertness: Awake/alert Behavior During Therapy: WFL for tasks assessed/performed Overall Cognitive Status: Within Functional Limits for tasks assessed                                     General Comments       Exercises Exercises: Other exercises Other Exercises Other Exercises: L shoulder AROM all planes; L elbow AROM; frequent wiggling of fingers   Shoulder Instructions  Home Living Family/patient expects to be discharged to:: Private residence Living Arrangements: Spouse/significant other Available Help at Discharge: Available PRN/intermittently Type of Home: House             Bathroom Shower/Tub: Walk-in shower;Tub/shower unit     Bathroom Accessibility: Yes              Prior Functioning/Environment Level of Independence: Independent        Comments: works/drives/exercises        OT Problem List: Decreased strength;Decreased range of motion;Decreased coordination;Decreased knowledge of use of DME or AE;Decreased knowledge of precautions;Impaired sensation;Pain;Impaired UE functional use;Increased edema      OT  Treatment/Interventions:      OT Goals(Current goals can be found in the care plan section) Acute Rehab OT Goals Patient Stated Goal: to have the use of her hand back OT Goal Formulation: With patient Time For Goal Achievement: 01/26/18 Potential to Achieve Goals: Good  OT Frequency:     Barriers to D/C:            Co-evaluation              AM-PAC PT "6 Clicks" Daily Activity     Outcome Measure Help from another person eating meals?: A Little Help from another person taking care of personal grooming?: A Little Help from another person toileting, which includes using toliet, bedpan, or urinal?: None Help from another person bathing (including washing, rinsing, drying)?: A Little Help from another person to put on and taking off regular upper body clothing?: A Little Help from another person to put on and taking off regular lower body clothing?: A Little 6 Click Score: 19   End of Session Nurse Communication: Weight bearing status;Precautions  Activity Tolerance: Patient tolerated treatment well Patient left: in bed;with call bell/phone within reach  OT Visit Diagnosis: Muscle weakness (generalized) (M62.81);Pain Pain - Right/Left: Left Pain - part of body: Arm                Time: 0840-0900 OT Time Calculation (min): 20 min Charges:  OT General Charges $OT Visit: 1 Visit OT Evaluation $OT Eval Low Complexity: 1 Low G-Codes:     Luisa Dago, OT/L  OT Clinical Specialist [phone]   [facility] 01/19/2018, 9:05 AM

## 2018-01-19 NOTE — Progress Notes (Signed)
Patient discharged to home with instructions and prescriptions. 

## 2018-01-19 NOTE — Discharge Summary (Signed)
Physician Discharge Summary  Patient ID: Jenna Palmer Portee MRN: [id number] DOB/AGE: February 28, 1961 57 y.o.  Admit date: 01/18/2018 Discharge date:   Admission Diagnoses: Left radius fracture, carpal tunnel syndrome Past Medical History:  Diagnosis Date  . Asthma    "only at times, but doesn't use an inhaler"    Discharge Diagnoses:  Active Problems:   Radius fracture   Other intraarticular fracture of lower end of left radius, initial encounter for closed fracture   Surgeries: Procedure(s): OPEN REDUCTION INTERNAL FIXATION (ORIF) RADIUS FRACTURE WITH CARPAL TUNNEL RELEASE AND REPAIR AS NECESSARY on 01/18/2018    Consultants:   Discharged Condition: Improved  Hospital Course: Jenna Palmer Escudero is an 57 y.o. female who was admitted 01/18/2018 with a chief complaint of No chief complaint on file. , and found to have a diagnosis of Left radius fracture, carpal tunnel syndrome.  They were brought to the operating room on 01/18/2018 and underwent Procedure(s): OPEN REDUCTION INTERNAL FIXATION (ORIF) RADIUS FRACTURE WITH CARPAL TUNNEL RELEASE AND REPAIR AS NECESSARY.    They were given perioperative antibiotics:  Anti-infectives (From admission, onward)   Start     Dose/Rate Route Frequency Ordered Stop   01/18/18 2000  ceFAZolin (ANCEF) IVPB 1 g/50 mL premix     1 g 100 mL/hr over 30 Minutes Intravenous Every 8 hours 01/18/18 1219     01/18/18 1230  ceFAZolin (ANCEF) IVPB 1 g/50 mL premix     1 g 100 mL/hr over 30 Minutes Intravenous NOW 01/18/18 1219 01/19/18 1230   01/18/18 0745  ceFAZolin (ANCEF) IVPB 2g/100 mL premix     2 g 200 mL/hr over 30 Minutes Intravenous On call to O.R. 01/18/18 [id number] 01/18/18 1005    .  They were given sequential compression devices, early ambulation, and Other (comment) for DVT prophylaxis.  Recent vital signs:  Patient Vitals for the past 24 hrs:  BP Temp Temp src Pulse Resp SpO2 Height Weight  01/19/18 0424 137/60 97.8 F (36.6 C) Oral (!) 49 17 95 %  - -  01/18/18 2037 (!) 112/49 98.5 F (36.9 C) Oral (!) 57 16 95 % - -  01/18/18 1758 133/70 - - (!) 55 - - - -  01/18/18 1736 (!) 92/55 98.3 F (36.8 C) Oral (!) 55 16 98 % - -  01/18/18 1215 130/66 (!) 97.5 F (36.4 C) Oral (!) 52 15 100 % 5\' 4"  (1.626 m) 87.9 kg (193 lb 12.6 oz)  01/18/18 1200 (!) 106/57 (!) 97.5 F (36.4 C) - (!) 50 17 100 % - -  01/18/18 1145 (!) 100/51 - - (!) 45 11 99 % - -  01/18/18 1130 (!) 97/48 - - (!) 53 12 100 % - -  01/18/18 1127 (!) 97/48 (!) 97.3 F (36.3 C) - (!) 55 12 100 % - -  .  Recent laboratory studies: No results found.  Discharge Medications:   Allergies as of 01/19/2018      Reactions   Codeine Other (See Comments)   Upset stomach   Latex Rash      Medication List    TAKE these medications   docusate sodium 100 MG capsule Commonly known as:  COLACE Take 100 mg by mouth daily.   ibuprofen 200 MG tablet Commonly known as:  ADVIL,MOTRIN Take 600 mg by mouth every 6 (six) hours as needed for headache or moderate pain.   montelukast 10 MG tablet Commonly known as:  SINGULAIR Take 10 mg by mouth every morning.  vitamin C 1000 MG tablet Take 1,000 mg by mouth daily.       Diagnostic Studies: No results found.  They benefited maximally from their hospital stay and there were no complications.     Disposition: Discharge disposition: 01-Home or Self Care      Discharge Instructions    Call MD / Call 911   Complete by:  As directed    If you experience chest pain or shortness of breath, CALL 911 and be transported to the hospital emergency room.  If you develope a fever above 101 F, pus (white drainage) or increased drainage or redness at the wound, or calf pain, call your surgeon's office.   Constipation Prevention   Complete by:  As directed    Drink plenty of fluids.  Prune juice may be helpful.  You may use a stool softener, such as Colace (over the counter) 100 mg twice a day.  Use MiraLax (over the counter) for  constipation as needed.   Diet - low sodium heart healthy   Complete by:  As directed    Increase activity slowly as tolerated   Complete by:  As directed         Signed: Oletta Cohn III 01/19/2018, 10:39 AM  Physician Discharge Summary  Patient ID: Jenna Palmer MRN: [id number] DOB/AGE: 1960-11-29 57 y.o.  Admit date: 01/18/2018 Discharge date:   Admission Diagnoses: Left radius fracture, carpal tunnel syndrome Past Medical History:  Diagnosis Date  . Asthma    "only at times, but doesn't use an inhaler"    Discharge Diagnoses:  Active Problems:   Radius fracture   Other intraarticular fracture of lower end of left radius, initial encounter for closed fracture   Surgeries: Procedure(s): OPEN REDUCTION INTERNAL FIXATION (ORIF) RADIUS FRACTURE WITH CARPAL TUNNEL RELEASE AND REPAIR AS NECESSARY on 01/18/2018    Consultants:   Discharged Condition: Improved  Hospital Course: JOYANNA KLEMAN is an 57 y.o. female who was admitted 01/18/2018 with a chief complaint of No chief complaint on file. , and found to have a diagnosis of Left radius fracture, carpal tunnel syndrome.  They were brought to the operating room on 01/18/2018 and underwent Procedure(s): OPEN REDUCTION INTERNAL FIXATION (ORIF) RADIUS FRACTURE WITH CARPAL TUNNEL RELEASE AND REPAIR AS NECESSARY.    They were given perioperative antibiotics:  Anti-infectives (From admission, onward)   Start     Dose/Rate Route Frequency Ordered Stop   01/18/18 2000  ceFAZolin (ANCEF) IVPB 1 g/50 mL premix     1 g 100 mL/hr over 30 Minutes Intravenous Every 8 hours 01/18/18 1219     01/18/18 1230  ceFAZolin (ANCEF) IVPB 1 g/50 mL premix     1 g 100 mL/hr over 30 Minutes Intravenous NOW 01/18/18 1219 01/19/18 1230   01/18/18 0745  ceFAZolin (ANCEF) IVPB 2g/100 mL premix     2 g 200 mL/hr over 30 Minutes Intravenous On call to O.R. 01/18/18 [id number] 01/18/18 1005    .  They were given sequential compression devices,  early ambulation, and Other (comment) for DVT prophylaxis.  Recent vital signs:  Patient Vitals for the past 24 hrs:  BP Temp Temp src Pulse Resp SpO2 Height Weight  01/19/18 0424 137/60 97.8 F (36.6 C) Oral (!) 49 17 95 % - -  01/18/18 2037 (!) 112/49 98.5 F (36.9 C) Oral (!) 57 16 95 % - -  01/18/18 1758 133/70 - - (!) 55 - - - -  01/18/18 1736 Marland Kitchen)  92/55 98.3 F (36.8 C) Oral (!) 55 16 98 % - -  01/18/18 1215 130/66 (!) 97.5 F (36.4 C) Oral (!) 52 15 100 % 5\' 4"  (1.626 m) 87.9 kg (193 lb 12.6 oz)  01/18/18 1200 (!) 106/57 (!) 97.5 F (36.4 C) - (!) 50 17 100 % - -  01/18/18 1145 (!) 100/51 - - (!) 45 11 99 % - -  01/18/18 1130 (!) 97/48 - - (!) 53 12 100 % - -  01/18/18 1127 (!) 97/48 (!) 97.3 F (36.3 C) - (!) 55 12 100 % - -  .  Recent laboratory studies: No results found.  Discharge Medications:   Allergies as of 01/19/2018      Reactions   Codeine Other (See Comments)   Upset stomach   Latex Rash      Medication List    TAKE these medications   docusate sodium 100 MG capsule Commonly known as:  COLACE Take 100 mg by mouth daily.   ibuprofen 200 MG tablet Commonly known as:  ADVIL,MOTRIN Take 600 mg by mouth every 6 (six) hours as needed for headache or moderate pain.   montelukast 10 MG tablet Commonly known as:  SINGULAIR Take 10 mg by mouth every morning.   vitamin C 1000 MG tablet Take 1,000 mg by mouth daily.       Diagnostic Studies: No results found.  They benefited maximally from their hospital stay and there were no complications.     Disposition: Discharge disposition: 01-Home or Self Care      Discharge Instructions    Call MD / Call 911   Complete by:  As directed    If you experience chest pain or shortness of breath, CALL 911 and be transported to the hospital emergency room.  If you develope a fever above 101 F, pus (white drainage) or increased drainage or redness at the wound, or calf pain, call your surgeon's office.    Constipation Prevention   Complete by:  As directed    Drink plenty of fluids.  Prune juice may be helpful.  You may use a stool softener, such as Colace (over the counter) 100 mg twice a day.  Use MiraLax (over the counter) for constipation as needed.   Diet - low sodium heart healthy   Complete by:  As directed    Increase activity slowly as tolerated   Complete by:  As directed       DC to home with RTC in 12-14 days Oxy IR and robaxin RX SP ORIF with CTR DRF  Signed: Oletta Cohn III 01/19/2018, 10:39 AM

## 2018-01-19 NOTE — Discharge Instructions (Signed)

## 2018-01-21 ENCOUNTER — Encounter (HOSPITAL_COMMUNITY): Payer: Self-pay | Admitting: Orthopedic Surgery
